# Patient Record
Sex: Male | Born: 1995 | Race: White | Hispanic: No | Marital: Single | Smoking: Never smoker
Health system: Southern US, Community
[De-identification: ages and names within clinical notes are randomized; demographics above are authoritative.]

## PROBLEM LIST (undated history)

## (undated) ENCOUNTER — Emergency Department (HOSPITAL_BASED_OUTPATIENT_CLINIC_OR_DEPARTMENT_OTHER): Admission: EM | Payer: Self-pay | Source: Home / Self Care

---
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
## 2016-07-07 DIAGNOSIS — Z79899 Other long term (current) drug therapy: Secondary | ICD-10-CM | POA: Insufficient documentation

## 2016-07-12 DIAGNOSIS — Z9114 Patient's other noncompliance with medication regimen: Secondary | ICD-10-CM | POA: Insufficient documentation

## 2021-01-30 ENCOUNTER — Ambulatory Visit (INDEPENDENT_AMBULATORY_CARE_PROVIDER_SITE_OTHER): Payer: Self-pay | Admitting: Sports Medicine

## 2021-01-30 ENCOUNTER — Encounter: Payer: Self-pay | Admitting: Sports Medicine

## 2021-01-30 ENCOUNTER — Other Ambulatory Visit: Payer: Self-pay

## 2021-01-30 DIAGNOSIS — F909 Attention-deficit hyperactivity disorder, unspecified type: Secondary | ICD-10-CM | POA: Insufficient documentation

## 2021-01-30 DIAGNOSIS — M79675 Pain in left toe(s): Secondary | ICD-10-CM

## 2021-01-30 DIAGNOSIS — L600 Ingrowing nail: Secondary | ICD-10-CM

## 2021-01-30 NOTE — Patient Instructions (Signed)

## 2021-01-30 NOTE — Progress Notes (Signed)
Subjective: Herbert Cabrera is a 25 y.o. male patient presents to office today complaining of a moderately painful incurvated, red, hot, swollen lateral greater than medial nail border of the first toe on the left foot. This has been present for 3 months off and on trying to get out but could not get it all out state that he has been soaking and using peroxide there is some tenderness 4 out of 10 to the area.  Patient denies any active drainage states that there is some occasional drainage of bloody nature.  Patient denies fever/chills/nausea/vomitting/any other related constitutional symptoms at this time.  Patient Active Problem List   Diagnosis Date Noted   ADHD (attention deficit hyperactivity disorder) 01/30/2021   Controlled substance agreement broken 07/12/2016   Controlled substance agreement signed 07/07/2016   High risk medication use 07/07/2016   Acute appendicitis 04/13/2015    No current outpatient medications on file prior to visit.   No current facility-administered medications on file prior to visit.    Allergies  Allergen Reactions   Amoxicillin Hives and Rash    Objective:  There were no vitals filed for this visit.  General: Well developed, nourished, in no acute distress, alert and oriented x3   Dermatology: Skin is warm, dry and supple bilateral.  Left hallux nail appears to be  severely incurvated with hyperkeratosis formation at the distal aspects of  the lateral greater than medial nail border. (+) Erythema. (+) Edema. (+) bloody drainage present. The remaining nails appear unremarkable at this time. There are no open sores, lesions or other signs of infection  present.  Vascular: Dorsalis Pedis artery and Posterior Tibial artery pedal pulses are 2/4 bilateral with immedate capillary fill time. Pedal hair growth present. No lower extremity edema.   Neruologic: Grossly intact via light touch bilateral.  Musculoskeletal: Tenderness to palpation of the left  hallux lateral nail fold(s). Muscular strength within normal limits in all groups bilateral.   Assesement and Plan: Problem List Items Addressed This Visit   None Visit Diagnoses     Ingrown nail    -  Primary   Toe pain, left           -Discussed treatment alternatives and plan of care; Explained permanent/temporary nail avulsion and post procedure course to patient. Patient elects for PNA with phenol left hallux medial and lateral borders - After a verbal and written consent, injected 3 ml of a 50:50 mixture of 2% plain  lidocaine and 0.5% plain marcaine in a normal hallux block fashion. Next, a  betadine prep was performed. Anesthesia was tested and found to be appropriate.  The offending left hallux medial and lateral nail borders were then incised from the hyponychium to the epinychium. The offending nail border was removed and cleared from the field. The area was curretted for any remaining nail or spicules. Phenol application performed and the area was then flushed with alcohol and dressed with antibiotic cream and a dry sterile dressing. -Patient was instructed to leave the dressing intact for today and begin soaking  in a weak solution of betadine or Epsom salt and water tomorrow. Patient was instructed to  soak for 15-20 minutes each day and apply Neosporin and a gauze or bandaid dressing each day. -Patient was instructed to monitor the toe for signs of infection and return to office if toe becomes red, hot or swollen. -Advised ice, elevation, and tylenol or motrin if needed for pain.  -Patient is to return in 2 weeks  for follow up care/nail check or sooner if problems arise.  Landis Martins, DPM

## 2021-02-20 ENCOUNTER — Ambulatory Visit: Payer: Self-pay | Admitting: Sports Medicine

## 2022-01-03 NOTE — ED Notes (Signed)
Pt seen getting into passenger seat of car that dropped him off.
